# Patient Record
Sex: Female | Born: 1935 | Race: White | Hispanic: No | Marital: Married
Health system: Southern US, Community
[De-identification: ages and names within clinical notes are randomized; demographics above are authoritative.]

---

## 2004-03-14 ENCOUNTER — Ambulatory Visit: Payer: Self-pay | Admitting: General Surgery

## 2004-07-24 ENCOUNTER — Ambulatory Visit: Payer: Self-pay

## 2004-09-10 ENCOUNTER — Ambulatory Visit: Payer: Self-pay | Admitting: General Surgery

## 2004-12-24 ENCOUNTER — Ambulatory Visit: Payer: Self-pay | Admitting: Cardiology

## 2005-02-06 ENCOUNTER — Ambulatory Visit: Payer: Self-pay | Admitting: Internal Medicine

## 2005-03-09 ENCOUNTER — Ambulatory Visit: Payer: Self-pay | Admitting: General Surgery

## 2005-09-07 ENCOUNTER — Ambulatory Visit: Payer: Self-pay | Admitting: General Surgery

## 2006-03-08 ENCOUNTER — Ambulatory Visit: Payer: Self-pay | Admitting: General Surgery

## 2006-06-23 ENCOUNTER — Ambulatory Visit: Payer: Self-pay | Admitting: Cardiology

## 2006-08-30 ENCOUNTER — Ambulatory Visit: Payer: Self-pay | Admitting: Psychiatry

## 2006-09-13 ENCOUNTER — Ambulatory Visit: Payer: Self-pay | Admitting: General Surgery

## 2006-11-04 ENCOUNTER — Emergency Department: Payer: Self-pay | Admitting: Emergency Medicine

## 2006-11-04 ENCOUNTER — Other Ambulatory Visit: Payer: Self-pay

## 2006-11-09 ENCOUNTER — Inpatient Hospital Stay: Payer: Self-pay | Admitting: Unknown Physician Specialty

## 2006-11-24 ENCOUNTER — Other Ambulatory Visit: Payer: Self-pay

## 2008-01-27 ENCOUNTER — Other Ambulatory Visit: Payer: Self-pay

## 2008-01-27 ENCOUNTER — Emergency Department: Payer: Self-pay | Admitting: Emergency Medicine

## 2008-03-29 ENCOUNTER — Inpatient Hospital Stay: Payer: Self-pay | Admitting: Internal Medicine

## 2008-07-09 ENCOUNTER — Ambulatory Visit: Payer: Self-pay | Admitting: Oncology

## 2008-07-26 ENCOUNTER — Ambulatory Visit: Payer: Self-pay | Admitting: Oncology

## 2008-08-06 ENCOUNTER — Ambulatory Visit: Payer: Self-pay | Admitting: Oncology

## 2008-08-10 ENCOUNTER — Emergency Department: Payer: Self-pay | Admitting: Emergency Medicine

## 2008-08-23 ENCOUNTER — Ambulatory Visit: Payer: Self-pay | Admitting: Oncology

## 2008-09-06 ENCOUNTER — Ambulatory Visit: Payer: Self-pay | Admitting: Oncology

## 2008-10-06 ENCOUNTER — Ambulatory Visit: Payer: Self-pay | Admitting: Oncology

## 2008-11-06 ENCOUNTER — Ambulatory Visit: Payer: Self-pay | Admitting: Oncology

## 2008-12-06 ENCOUNTER — Ambulatory Visit: Payer: Self-pay | Admitting: Oncology

## 2009-02-06 ENCOUNTER — Ambulatory Visit: Payer: Self-pay | Admitting: Oncology

## 2009-02-14 ENCOUNTER — Ambulatory Visit: Payer: Self-pay | Admitting: Oncology

## 2009-03-08 ENCOUNTER — Ambulatory Visit: Payer: Self-pay | Admitting: Oncology

## 2009-05-16 ENCOUNTER — Ambulatory Visit: Payer: Self-pay | Admitting: Oncology

## 2009-05-27 ENCOUNTER — Ambulatory Visit: Payer: Self-pay | Admitting: Oncology

## 2009-06-08 ENCOUNTER — Ambulatory Visit: Payer: Self-pay | Admitting: Oncology

## 2009-07-09 ENCOUNTER — Ambulatory Visit: Payer: Self-pay | Admitting: Oncology

## 2011-08-28 ENCOUNTER — Ambulatory Visit: Payer: Self-pay | Admitting: Oncology

## 2011-08-28 LAB — CBC CANCER CENTER
Basophil #: 0 x10 3/mm (ref 0.0–0.1)
Basophil %: 0.3 %
Eosinophil #: 0.1 x10 3/mm (ref 0.0–0.7)
HCT: 28.8 % — ABNORMAL LOW (ref 35.0–47.0)
Lymphocyte #: 1.1 x10 3/mm (ref 1.0–3.6)
Lymphocyte %: 18 %
MCHC: 34.1 g/dL (ref 32.0–36.0)
MCV: 101 fL — ABNORMAL HIGH (ref 80–100)
Neutrophil #: 4.5 x10 3/mm (ref 1.4–6.5)
Platelet: 228 x10 3/mm (ref 150–440)
RBC: 2.87 10*6/uL — ABNORMAL LOW (ref 3.80–5.20)
RDW: 14 % (ref 11.5–14.5)

## 2011-09-07 ENCOUNTER — Ambulatory Visit: Payer: Self-pay | Admitting: Oncology

## 2013-04-20 ENCOUNTER — Emergency Department: Payer: Self-pay | Admitting: Internal Medicine

## 2013-04-20 LAB — URINALYSIS, COMPLETE
Blood: NEGATIVE
Glucose,UR: NEGATIVE mg/dL (ref 0–75)
Hyaline Cast: 3
Nitrite: POSITIVE
Protein: NEGATIVE
Squamous Epithelial: <1

## 2013-04-20 LAB — TROPONIN I: Troponin-I: <0.02 ng/mL

## 2013-04-20 LAB — COMPREHENSIVE METABOLIC PANEL
Anion Gap: 3 — ABNORMAL LOW (ref 7–16)
BUN: 17 mg/dL (ref 7–18)
Calcium, Total: 8.3 mg/dL — ABNORMAL LOW (ref 8.5–10.1)
Chloride: 107 mmol/L (ref 98–107)
Co2: 29 mmol/L (ref 21–32)
EGFR (African American): >60
EGFR (Non-African Amer.): 54 — ABNORMAL LOW
Glucose: 109 mg/dL — ABNORMAL HIGH (ref 65–99)
Osmolality: 280 (ref 275–301)
Potassium: 3.9 mmol/L (ref 3.5–5.1)
Sodium: 139 mmol/L (ref 136–145)
Total Protein: 8.6 g/dL — ABNORMAL HIGH (ref 6.4–8.2)

## 2013-04-20 LAB — CK TOTAL AND CKMB (NOT AT ARMC)
CK, Total: 48 U/L (ref 21–215)
CK-MB: 0.7 ng/mL (ref 0.5–3.6)

## 2013-04-20 LAB — CBC
MCH: 35.3 pg — ABNORMAL HIGH (ref 26.0–34.0)
MCHC: 34.2 g/dL (ref 32.0–36.0)
MCV: 103 fL — ABNORMAL HIGH (ref 80–100)
RDW: 13.6 % (ref 11.5–14.5)
WBC: 5.6 10*3/uL (ref 3.6–11.0)

## 2013-04-20 LAB — APTT: Activated PTT: 27.5 secs (ref 23.6–35.9)

## 2013-04-20 LAB — PRO B NATRIURETIC PEPTIDE: B-Type Natriuretic Peptide: 1084 pg/mL — ABNORMAL HIGH (ref 0–450)

## 2013-04-20 LAB — PROTIME-INR: Prothrombin Time: 13 secs (ref 11.5–14.7)

## 2013-05-30 ENCOUNTER — Emergency Department: Payer: Self-pay | Admitting: Emergency Medicine

## 2013-05-30 LAB — APTT: Activated PTT: 25 secs (ref 23.6–35.9)

## 2013-05-30 LAB — CBC
HCT: 26.8 % — ABNORMAL LOW (ref 35.0–47.0)
MCH: 33.6 pg (ref 26.0–34.0)
MCHC: 32.7 g/dL (ref 32.0–36.0)
MCV: 103 fL — ABNORMAL HIGH (ref 80–100)
Platelet: 189 10*3/uL (ref 150–440)
RDW: 13.8 % (ref 11.5–14.5)
WBC: 6.2 10*3/uL (ref 3.6–11.0)

## 2013-05-30 LAB — COMPREHENSIVE METABOLIC PANEL
Anion Gap: 3 — ABNORMAL LOW (ref 7–16)
BUN: 21 mg/dL — ABNORMAL HIGH (ref 7–18)
Calcium, Total: 8.7 mg/dL (ref 8.5–10.1)
Co2: 29 mmol/L (ref 21–32)
Creatinine: 1 mg/dL (ref 0.60–1.30)
EGFR (African American): 57 — ABNORMAL LOW
EGFR (Non-African Amer.): 50 — ABNORMAL LOW
Osmolality: 273 (ref 275–301)
Potassium: 3.7 mmol/L (ref 3.5–5.1)
SGOT(AST): 21 U/L (ref 15–37)
Sodium: 135 mmol/L — ABNORMAL LOW (ref 136–145)

## 2013-05-30 LAB — CK TOTAL AND CKMB (NOT AT ARMC)
CK, Total: 76 U/L (ref 21–215)
CK-MB: 1.1 ng/mL (ref 0.5–3.6)

## 2013-05-30 LAB — PROTIME-INR: INR: 1

## 2013-05-30 LAB — TROPONIN I: Troponin-I: <0.02 ng/mL

## 2013-05-31 LAB — URINALYSIS, COMPLETE
Bilirubin,UR: NEGATIVE
Ketone: NEGATIVE
Ph: 6 (ref 4.5–8.0)
Protein: NEGATIVE
RBC,UR: 1 /HPF (ref 0–5)
Specific Gravity: 1.018 (ref 1.003–1.030)

## 2013-07-16 ENCOUNTER — Emergency Department: Payer: Self-pay | Admitting: Emergency Medicine

## 2013-07-16 LAB — COMPREHENSIVE METABOLIC PANEL
ALK PHOS: 145 U/L — AB
Albumin: 2.9 g/dL — ABNORMAL LOW (ref 3.4–5.0)
Anion Gap: 7 (ref 7–16)
BUN: 13 mg/dL (ref 7–18)
Bilirubin,Total: 0.2 mg/dL (ref 0.2–1.0)
CHLORIDE: 102 mmol/L (ref 98–107)
CREATININE: 1.11 mg/dL (ref 0.60–1.30)
Calcium, Total: 8.6 mg/dL (ref 8.5–10.1)
Co2: 25 mmol/L (ref 21–32)
EGFR (African American): 51 — ABNORMAL LOW
EGFR (Non-African Amer.): 44 — ABNORMAL LOW
GLUCOSE: 116 mg/dL — AB (ref 65–99)
Osmolality: 269 (ref 275–301)
Potassium: 3.4 mmol/L — ABNORMAL LOW (ref 3.5–5.1)
SGOT(AST): 15 U/L (ref 15–37)
SGPT (ALT): 16 U/L (ref 12–78)
Sodium: 134 mmol/L — ABNORMAL LOW (ref 136–145)
Total Protein: 9.6 g/dL — ABNORMAL HIGH (ref 6.4–8.2)

## 2013-07-16 LAB — CBC WITH DIFFERENTIAL/PLATELET
Basophil #: 0 10*3/uL (ref 0.0–0.1)
Basophil %: 0.4 %
EOS ABS: 0 10*3/uL (ref 0.0–0.7)
Eosinophil %: 0.4 %
HCT: 26.5 % — ABNORMAL LOW (ref 35.0–47.0)
HGB: 8.7 g/dL — AB (ref 12.0–16.0)
Lymphocyte #: 1.9 10*3/uL (ref 1.0–3.6)
Lymphocyte %: 30 %
MCH: 33.9 pg (ref 26.0–34.0)
MCHC: 32.8 g/dL (ref 32.0–36.0)
MCV: 103 fL — ABNORMAL HIGH (ref 80–100)
Monocyte #: 0.5 x10 3/mm (ref 0.2–0.9)
Monocyte %: 7.8 %
NEUTROS ABS: 4 10*3/uL (ref 1.4–6.5)
NEUTROS PCT: 61.4 %
Platelet: 219 10*3/uL (ref 150–440)
RBC: 2.56 10*6/uL — AB (ref 3.80–5.20)
RDW: 14.9 % — ABNORMAL HIGH (ref 11.5–14.5)
WBC: 6.4 10*3/uL (ref 3.6–11.0)

## 2013-07-16 LAB — URINALYSIS, COMPLETE
BACTERIA: NEGATIVE
BILIRUBIN, UR: NEGATIVE
Blood: NEGATIVE
Glucose,UR: NEGATIVE mg/dL (ref 0–75)
Ketone: NEGATIVE
Leukocyte Esterase: NEGATIVE
Nitrite: NEGATIVE
PH: 5 (ref 4.5–8.0)
RBC, UR: NONE SEEN /HPF (ref 0–5)
Specific Gravity: 1.018 (ref 1.003–1.030)

## 2013-07-16 LAB — TROPONIN I

## 2013-07-17 ENCOUNTER — Emergency Department: Payer: Self-pay | Admitting: Emergency Medicine

## 2013-07-17 LAB — CBC
HCT: 24 % — AB (ref 35.0–47.0)
HGB: 8.1 g/dL — ABNORMAL LOW (ref 12.0–16.0)
MCH: 34.6 pg — ABNORMAL HIGH (ref 26.0–34.0)
MCHC: 33.7 g/dL (ref 32.0–36.0)
MCV: 102 fL — ABNORMAL HIGH (ref 80–100)
PLATELETS: 196 10*3/uL (ref 150–440)
RBC: 2.34 10*6/uL — ABNORMAL LOW (ref 3.80–5.20)
RDW: 15.1 % — ABNORMAL HIGH (ref 11.5–14.5)
WBC: 8.8 10*3/uL (ref 3.6–11.0)

## 2013-07-18 LAB — COMPREHENSIVE METABOLIC PANEL
AST: 24 U/L (ref 15–37)
Albumin: 3.1 g/dL — ABNORMAL LOW (ref 3.4–5.0)
Alkaline Phosphatase: 134 U/L — ABNORMAL HIGH
Anion Gap: 7 (ref 7–16)
BILIRUBIN TOTAL: 0.3 mg/dL (ref 0.2–1.0)
BUN: 18 mg/dL (ref 7–18)
CALCIUM: 8.9 mg/dL (ref 8.5–10.1)
CHLORIDE: 101 mmol/L (ref 98–107)
Co2: 27 mmol/L (ref 21–32)
Creatinine: 0.95 mg/dL (ref 0.60–1.30)
EGFR (Non-African Amer.): 53 — ABNORMAL LOW
Glucose: 122 mg/dL — ABNORMAL HIGH (ref 65–99)
Osmolality: 273 (ref 275–301)
POTASSIUM: 3.1 mmol/L — AB (ref 3.5–5.1)
SGPT (ALT): 22 U/L (ref 12–78)
SODIUM: 135 mmol/L — AB (ref 136–145)
Total Protein: 9.9 g/dL — ABNORMAL HIGH (ref 6.4–8.2)

## 2013-07-18 LAB — URINALYSIS, COMPLETE
BLOOD: NEGATIVE
Bilirubin,UR: NEGATIVE
GLUCOSE, UR: NEGATIVE mg/dL (ref 0–75)
KETONE: NEGATIVE
NITRITE: NEGATIVE
PH: 6 (ref 4.5–8.0)
Specific Gravity: 1.018 (ref 1.003–1.030)
Squamous Epithelial: <1

## 2013-07-18 LAB — TROPONIN I

## 2013-07-20 LAB — URINE CULTURE

## 2013-08-06 ENCOUNTER — Ambulatory Visit: Payer: Self-pay | Admitting: Internal Medicine

## 2013-08-08 ENCOUNTER — Inpatient Hospital Stay: Payer: Self-pay | Admitting: Specialist

## 2013-08-08 LAB — COMPREHENSIVE METABOLIC PANEL
ALBUMIN: 2.6 g/dL — AB (ref 3.4–5.0)
ANION GAP: 5 — AB (ref 7–16)
AST: 19 U/L (ref 15–37)
Alkaline Phosphatase: 123 U/L — ABNORMAL HIGH
BILIRUBIN TOTAL: 0.1 mg/dL — AB (ref 0.2–1.0)
BUN: 23 mg/dL — AB (ref 7–18)
CHLORIDE: 108 mmol/L — AB (ref 98–107)
CO2: 25 mmol/L (ref 21–32)
Calcium, Total: 7.9 mg/dL — ABNORMAL LOW (ref 8.5–10.1)
Creatinine: 1.5 mg/dL — ABNORMAL HIGH (ref 0.60–1.30)
EGFR (African American): 35 — ABNORMAL LOW
EGFR (Non-African Amer.): 30 — ABNORMAL LOW
Glucose: 91 mg/dL (ref 65–99)
OSMOLALITY: 279 (ref 275–301)
POTASSIUM: 3.9 mmol/L (ref 3.5–5.1)
SGPT (ALT): 14 U/L (ref 12–78)
Sodium: 138 mmol/L (ref 136–145)
TOTAL PROTEIN: 8.7 g/dL — AB (ref 6.4–8.2)

## 2013-08-08 LAB — FERRITIN: Ferritin (ARMC): 173 ng/mL (ref 8–388)

## 2013-08-08 LAB — CBC
HCT: 18.7 % — ABNORMAL LOW (ref 35.0–47.0)
HGB: 6.2 g/dL — ABNORMAL LOW (ref 12.0–16.0)
MCH: 34.1 pg — AB (ref 26.0–34.0)
MCHC: 33.1 g/dL (ref 32.0–36.0)
MCV: 103 fL — ABNORMAL HIGH (ref 80–100)
Platelet: 190 10*3/uL (ref 150–440)
RBC: 1.82 10*6/uL — ABNORMAL LOW (ref 3.80–5.20)
RDW: 16.4 % — ABNORMAL HIGH (ref 11.5–14.5)
WBC: 5.7 10*3/uL (ref 3.6–11.0)

## 2013-08-08 LAB — CBC WITH DIFFERENTIAL/PLATELET
Basophil #: 0 10*3/uL (ref 0.0–0.1)
Basophil %: 0.8 %
EOS ABS: 0 10*3/uL (ref 0.0–0.7)
EOS PCT: 0.6 %
HCT: 23.3 % — ABNORMAL LOW (ref 35.0–47.0)
HGB: 7.8 g/dL — ABNORMAL LOW (ref 12.0–16.0)
Lymphocyte #: 1.4 10*3/uL (ref 1.0–3.6)
Lymphocyte %: 26.3 %
MCH: 32.7 pg (ref 26.0–34.0)
MCHC: 33.4 g/dL (ref 32.0–36.0)
MCV: 98 fL (ref 80–100)
MONO ABS: 0.4 x10 3/mm (ref 0.2–0.9)
Monocyte %: 7.1 %
NEUTROS ABS: 3.6 10*3/uL (ref 1.4–6.5)
Neutrophil %: 65.2 %
Platelet: 200 10*3/uL (ref 150–440)
RBC: 2.38 10*6/uL — ABNORMAL LOW (ref 3.80–5.20)
RDW: 18.8 % — AB (ref 11.5–14.5)
WBC: 5.5 10*3/uL (ref 3.6–11.0)

## 2013-08-08 LAB — RETICULOCYTES
Absolute Retic Count: 0.0298 10*6/uL (ref 0.019–0.186)
Reticulocyte: 1.65 % (ref 0.4–3.1)

## 2013-08-08 LAB — IRON: Iron: 216 ug/dL — ABNORMAL HIGH (ref 50–170)

## 2013-09-06 ENCOUNTER — Ambulatory Visit: Payer: Self-pay | Admitting: Internal Medicine

## 2013-09-06 DEATH — deceased

## 2014-09-29 NOTE — H&P (Signed)
PATIENT NAME:  Veronica Lopez, DECARLO MR#:  [id number] DATE OF BIRTH:  04/30/1921  DATE OF ADMISSION:  08/08/2013  REFERRING PHYSICIAN:   Dr. Charlesetta Ivory.   PRIMARY CARE PHYSICIAN:  Dr. Hoy Morn from [facility].  CHIEF COMPLAINT: Anemia.   HISTORY OF PRESENT ILLNESS: This is a 81 year old female with history of advanced dementia, sent from nursing home for anemia. The patient had routine blood work done, which the result showing low hemoglobin. Nursing home was ordered by the physician to sent the patient to the ED for transfusion. Nursing staff does not recall what is the value and they did not have a documentation then.  Upon presentation, the patient had basic workup done, which did show her hemoglobin to be of 6.2. The patient had hemoglobin done previously before 3 weeks, on February 9th, where it was 8.1 then. The patient was Hemoccult negative in the ED. The patient, as well, has no history of any melena, any bright red blood per rectum or any coffee-ground emesis The patient has advanced dementia. Cannot give any reliable history. History was mainly obtained from ED staff and previous documentation. It seems the patient had anemia and has been seen in hematology in the past; last time in 2013 for workup for anemia, where she was on Procrit then for anemia of chronic disease and possible multiple myeloma.   PAST MEDICAL HISTORY: 1.  Hypertension.  2.  Depression.  3.  History of aortic aneurysm status post repair.  4.  History of mitral valve stenosis, status post replacement with porcine valve.  5.  History of breast cancer in the past.  6.  History of gastroesophageal reflux disease.  7.  Anxiety.  8.  Multiple myeloma.  9.  Congestive heart failure.  10.  Dementia. 11.  Renal insufficiency.  12.  Anemia.   PAST SURGICAL HISTORY:  1.  Mastectomy.  2.  Mitral valve replacement.  3.  Right lobectomy.   SOCIAL HISTORY: She does not smoke, does not drink alcohol. Lives in [facility], which is an assisted living facility.   FAMILY HISTORY: No history of coronary artery disease, hypertension or diabetes in the family.   ALLERGIES: No known drug allergies.   HOME MEDICATIONS: 1.  Cyanocobalamin 1000 mg injection every month.  2.  Alprazolam 0.5 mg oral 1/2 tablet by mouth daily.  3.  Amlodipine 10 mg oral daily.  4.  Sertraline 150 mg oral daily.  5.  Losartan 50 mg oral daily.  6.  Senna Plus 1 tablet twice a day.  7.  Metoclopramide 1 tablet by mouth 4 times a day.  8.  Trazodone 50 mg oral at bedtime.  9.  Loperamide as needed.  10.  Albuterol as needed.  11.  Tylenol as needed.  12.  Milk of magnesia as needed.  13.  Zofran as needed.  14.  MiraLAX as needed.   REVIEW OF SYSTEMS: The patient has advanced dementia, very poor historian; cannot give reliable review of systems.   PHYSICAL EXAMINATION: VITAL SIGNS: Temperature 98.9, pulse 72, respiratory rate 18, blood pressure 145/73, saturating 98% on room air.  GENERAL: Frail, elderly female, looks comfortable in bed, in no apparent distress.  HEENT: Head atraumatic, normocephalic. Pupils equal and reactive to light. Pale conjunctivae. Anicteric sclerae. Dry oral mucosa.  NECK: Supple. No thyromegaly. No JVD.  CHEST: Good air entry bilaterally. No wheezing, rales or rhonchi.  CARDIOVASCULAR: S1, S2 heard. No rubs or gallops. Has systolic ejection murmur.  ABDOMEN: Soft, nontender, nondistended. Bowel sounds present.  EXTREMITIES: No edema. No clubbing. No cyanosis.  PSYCHIATRIC: The patient is awake. Flat affect. Only oriented to name.  NEUROLOGIC:  Cranial nerves grossly intact. Motor: Appears to be moving all extremities without gross deficits.  SKIN: Pale, warm and dry.  MUSCULOSKELETAL: No joint effusion or erythema. RECTAL:  Exam was done by me. The patient is Hemoccult negative. Does have mild external hemorrhoid, but there appears to be no source of bleed; as well, her stool is  normal in  color.    PERTINENT LABORATORY DATA:  Glucose 91, BUN 23, creatinine 1.50, sodium 138, potassium 3.9, chloride 108, CO2 of 25. Total protein 8.7, albumin 2.6, bili 0.1, alk phos  123. White blood cell 5.7, hemoglobin 6.2, platelets 190.   ASSESSMENT AND PLAN: 1.  Anemia. The patient presents with significant anemia. She has baseline anemia due to anemia of chronic disease and possible multiple myeloma, but she does have significant drop over the last 2 weeks of 2 grams, so I doubt any of these chronic causes would cause it to happen. We will send anemia workup for the time being. We will transfuse the patient 2 units of packed red blood cells. Currently, the patient is Hemoccult negative. We will obtain CT abdomen and pelvis to evaluate for any source of intra-abdominal bleed, possible retroperitoneal bleed, even though it is unlikely as she not on anticoagulation. Consent was obtained by the phone from the patient's husband. 2.  Acute renal failure. The patient had bump in her creatinine. This is most likely related to volume depletion from her anemia. We will hold the patient's losartan. We will continue with the fluids, and we will hydrate appropriately. We will repeat level in the morning.  3.  Hypertension. Blood pressure is acceptable, but we will hold all her meds until she is more stable.  4.  History of dementia. We will continue with supportive care.  5.  (Dictation Anomaly) malnutrition.  The patient appears malnourished and her albumin is low. We will start her on dietary nutritional supplements of Ensure when her n.p.o. is discontinued after she is stable, pending her imaging study.  6.  Deep vein thrombosis prophylaxis. Hold all forms of chemical anticoagulation due to her significant anemia requiring transfusion, pending the workup. We will continue with sequential compression devices.   CODE STATUS: This was discussed with the husband who confirms the patient is DO NOT RESUSCITATE; as  well, the patient has DNR form from the assisted living facility with her.  TOTAL TIME SPENT ON ADMISSION AND PATIENT CARE: 50 minutes.     ____________________________ Albertine Patricia, MD dse:dmm D: 08/08/2013 05:06:00 ET T: 08/08/2013 12:35:53 ET JOB#: [id number]  cc: Albertine Patricia, MD, <Dictator> DAWOOD Graciela Husbands MD ELECTRONICALLY SIGNED 08/10/2013 9:03

## 2014-09-29 NOTE — Discharge Summary (Signed)
PATIENT NAME:  Veronica Lopez, Veronica Lopez MR#:  [id number] DATE OF BIRTH:  1920/09/22  DATE OF ADMISSION:  08/08/2013 DATE OF DISCHARGE:  08/08/2013  For a detailed note, please see the history and physical done on admission by Dr. Waldron Labs.   DIAGNOSES AT DISCHARGE: 1.  Acute on chronic anemia, status post transfusion.  2.  History of breast cancer.  3.  History of multiple myeloma.  4.  Dementia.  5.  Gastroesophageal reflux disease.  6.  Hypertension.  7.  Anxiety/depression.    DISPOSITION: The patient is being discharged back to her assisted living facility with hospice services.   FOLLOWUP: With Dr. Hortencia Pilar in the next 1 to 2 weeks.   DIET: The patient is being discharged on a low-sodium mechanical soft diet.   DISCHARGE ACTIVITY: As tolerated.   DISCHARGE MEDICATIONS: Vitamin B12 1000 mcg intramuscular monthly, amlodipine 10 mg daily, Zoloft 100 mg 1-1/2 tabs daily, losartan 50 mg b.i.d., Reglan 10 mg q.i.d., trazodone 50 mg at bedtime, MiraLax daily as needed, Proctozone rectal cream to be applied t.i.d. as needed, Xanax 0.5 mg b.i.d., Senokot 50/8.6, 1 tab b.i.d., Xanax 0.25 mg daily at noon, albuterol nebulizer 4 times daily as needed, loperamide 2 mg one cap q. two hours as needed, Tylenol 650, 2 tabs q.4 hours as needed, milk of magnesia 30 mL b.i.d., Zofran 8 mg daily as needed.   CONSULTANTS DURING HOSPITAL COURSE: Dr. Izora Gala Phifer of palliative care.   PERTINENT STUDIES DONE THE HOSPITAL COURSE: Are as follows: A CT scan of the abdomen and pelvis done without contrast on admission showing no acute intra-abdominal hematoma, acute unhealed L4 compression fracture with mild retropulsion L1, L2, L3 compression fractures appear remote.   BRIEF HOSPITAL COURSE: This is a 81 year old female who resides at [facility], presented to the hospital, as she was notified that her lab values at her primary care physician's office were abnormal.   1.  Acute on chronic  anemia. The patient apparently has a baseline hemoglobin of around 8 to 9; hemoglobin was noted to be as low as 6.2 on blood work done in her primary care physician's office. Therefore, she was referred to the hospital. She underwent 1 unit packed red cells transfusion. Her hemoglobin has improved to over 7.8. The patient's family does not want to pursue any aggressive care and does not want any further aggressive intervention or work-up for the anemia. They have opted more for comfort care. A palliative care consult was obtained. The patient was seen by Dr. Ermalinda Memos. She discussed the case with the patient's family, and they have agreed to discharge her back to the [facility] with a new hospice service, and not to be rehospitalized again.  2.  Hypertension. The patient's hemodynamics remained stable in the hospital. She will resume her medications as stated.  3.  Anxiety/depression. The patient will resume her medications as stated above for anxiety, depression.   The patient's son, who is the power of attorney, and the husband do not want to pursue any aggressive care for this patient. They have switched hospice services. At this point, the patient is being discharged back to [facility], not to be rehospitalized again.   CODE STATUS: The patient is a do not intubate/do not resuscitate.   TIME SPENT: 45 minutes.   ____________________________ Belia Heman. Verdell Carmine, MD vjs:cg D: 08/08/2013 16:42:36 ET T: 08/09/2013 05:00:23 ET JOB#: [id number]  cc: Belia Heman. Verdell Carmine, MD, <Dictator> Kerin Perna, MD Harriette Ohara  Gaetano Net MD ELECTRONICALLY SIGNED 08/20/2013 22:33

## 2015-08-24 IMAGING — CR DG CHEST 1V PORT
1 series · 1 of 1 positions shown · non-contrast
Comparison: 03/30/2008

CLINICAL DATA: Chest pain

EXAM:
PORTABLE CHEST - 1 VIEW

[ap]
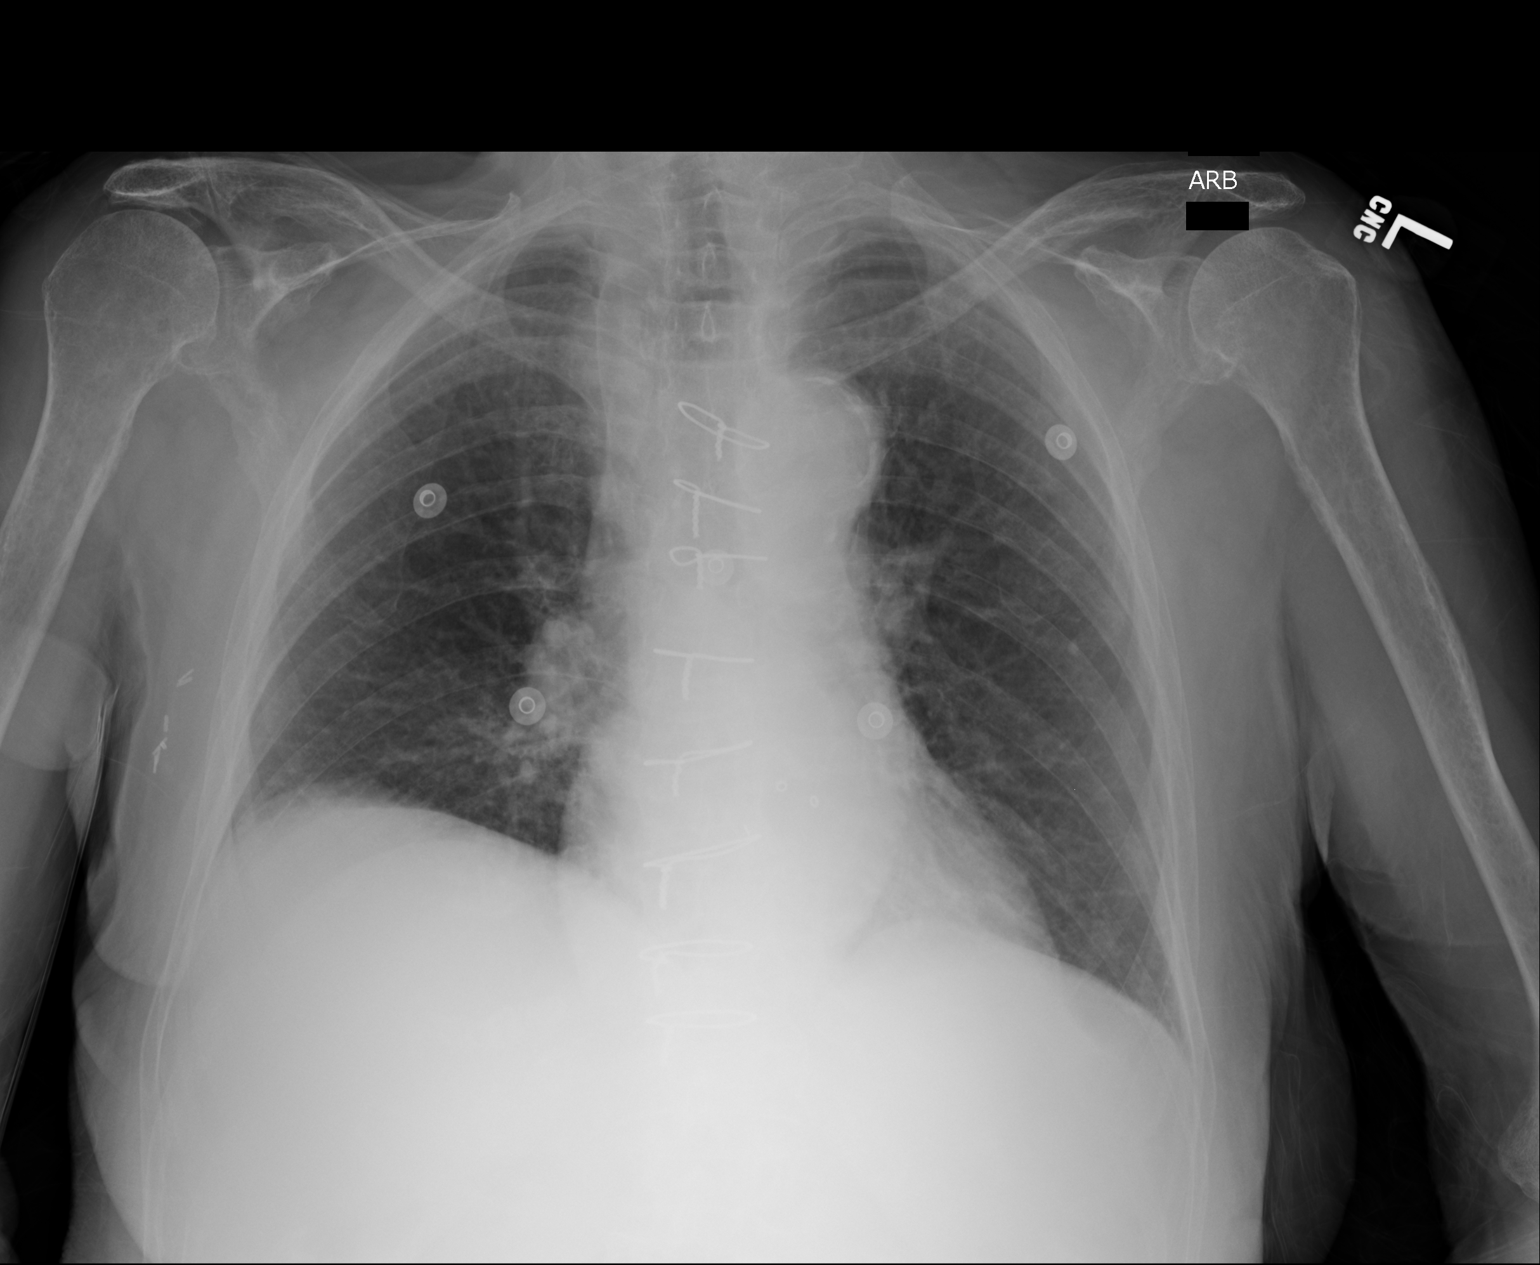

[1 of 1 positions shown; findings below may reference images not displayed]

FINDINGS: The cardiac shadow is stable. Postsurgical changes are again noted.
Previously seen infiltrate has resolved in the interval. Elevation
the right hemidiaphragm is noted. No focal infiltrate or sizable
effusion is seen. No bony abnormality is noted.
IMPRESSION: No acute abnormality noted.

## 2015-10-03 IMAGING — CT CT HEAD WITHOUT CONTRAST
3 of 5 series · 16 of 47 positions shown, 19 images · non-contrast
Comparison: CT head 08/10/2008.

CLINICAL DATA: Fall from toilet.  Abrasion to the left forehead.

EXAM:
CT HEAD WITHOUT CONTRAST
CT CERVICAL SPINE WITHOUT CONTRAST
TECHNIQUE: Multidetector CT imaging of the head and cervical spine was
performed following the standard protocol without intravenous
contrast. Multiplanar CT image reconstructions of the cervical spine
were also generated.

[Series 6: sag bone · sagittal · 0.34mm/px · 5 of 41 slices shown, 6 images]
[im 7/41  bone]
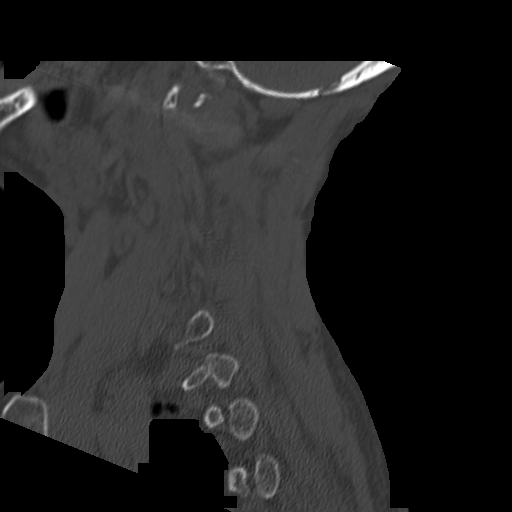
[im 14/41  bone]
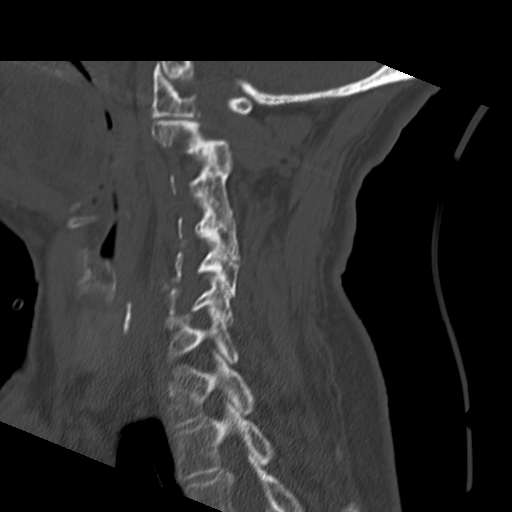
[im 21/41  brain]
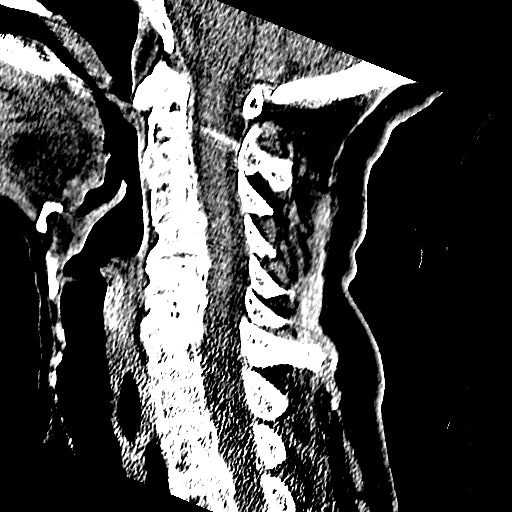
[im 21/41  bone]
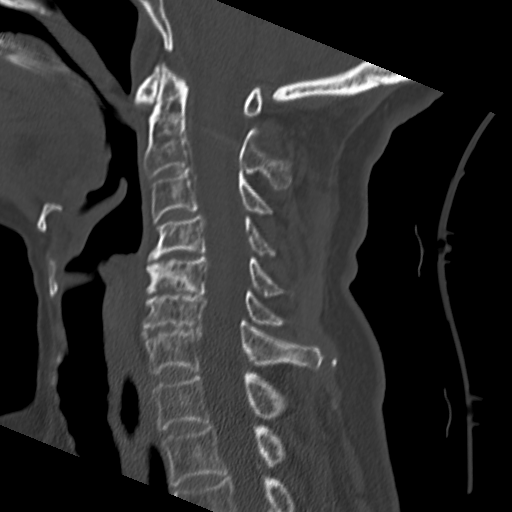
[im 27/41  bone]
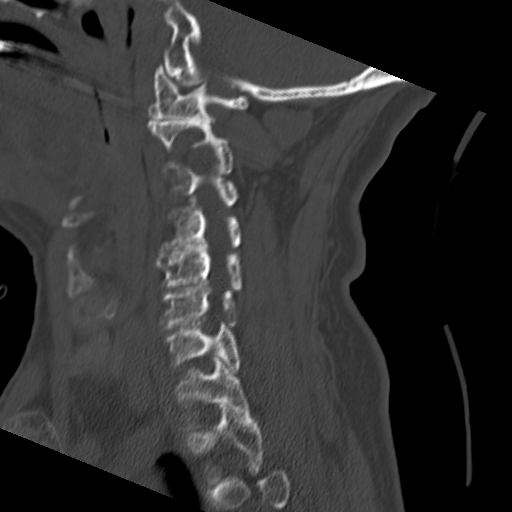
[im 34/41  bone]
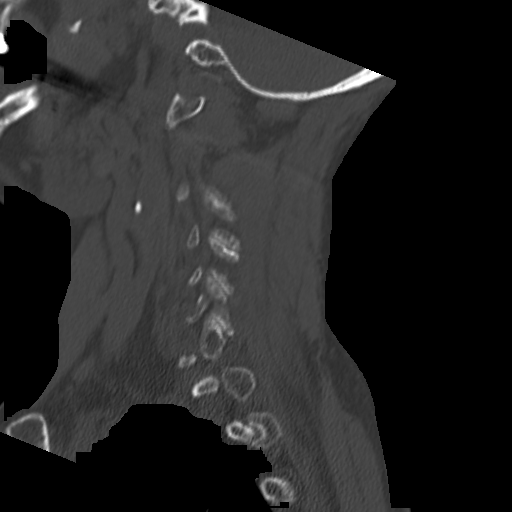

[Series 7: cor bone · coronal · 0.34mm/px · 3 of 34 slices shown]
[im 12/34  bone]
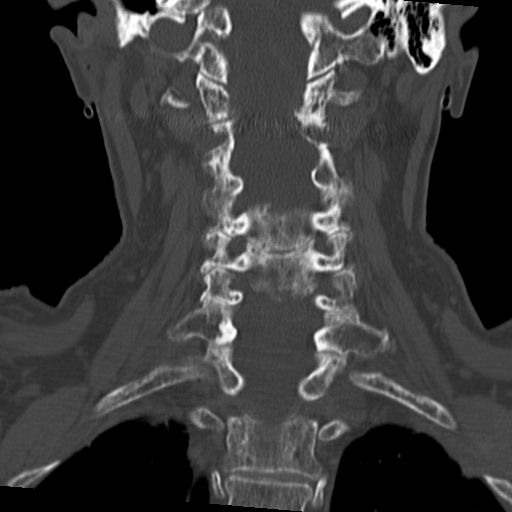
[im 15/34  bone]
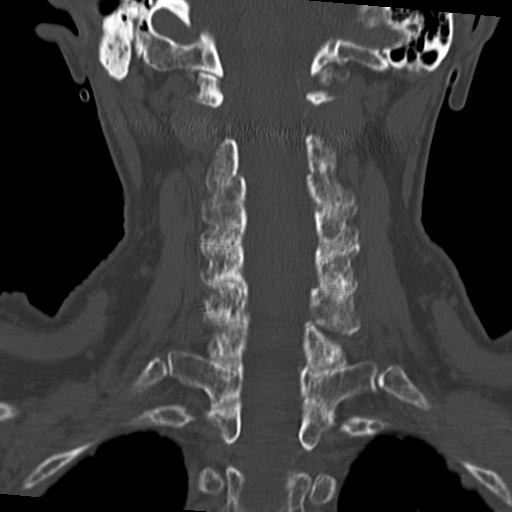
[im 19/34  bone]
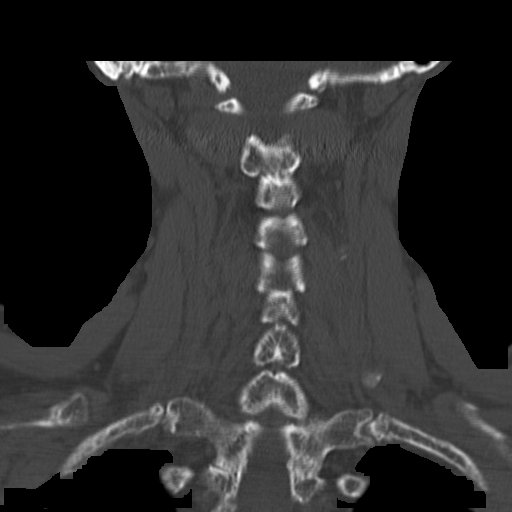

[Series 8: orthogonal axials · axial · 0.29mm/px · z∈[+552,+680]mm · 8 of 83 slices shown, 10 images]
[im 7/83  brain]
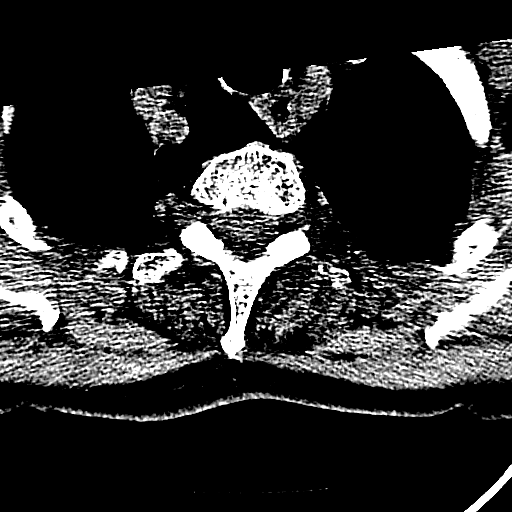
[im 7/83  bone]
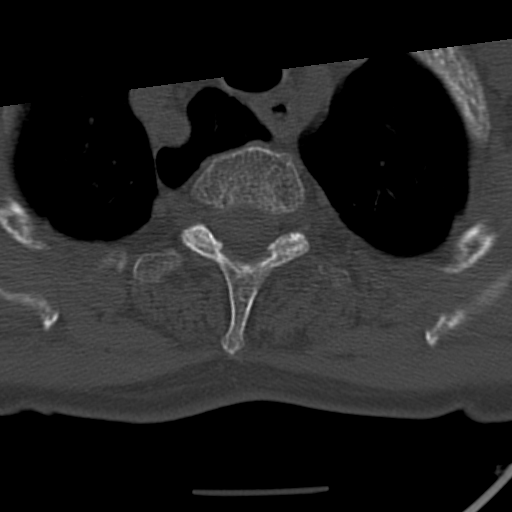
[im 21/83  bone]
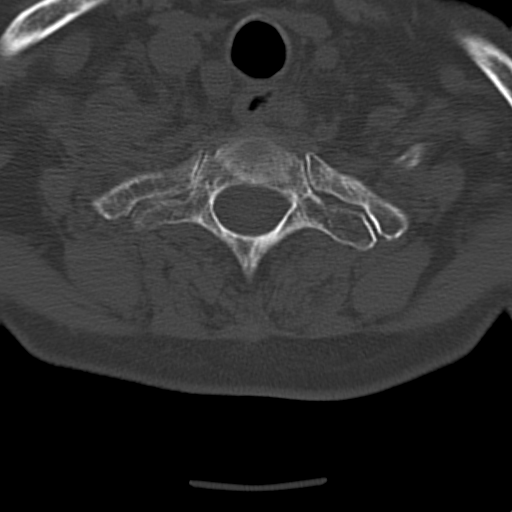
[im 28/83  bone]
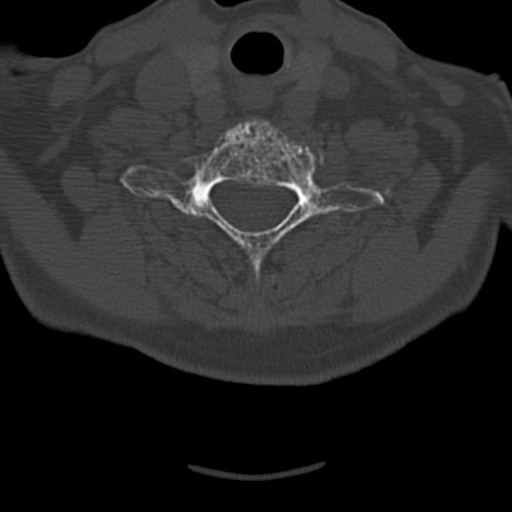
[im 35/83  bone]
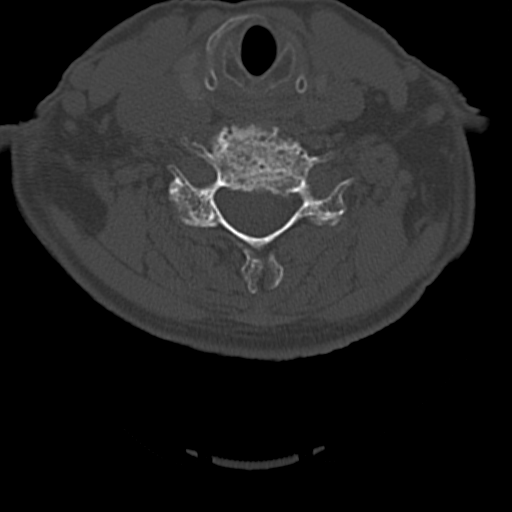
[im 48/83  brain]
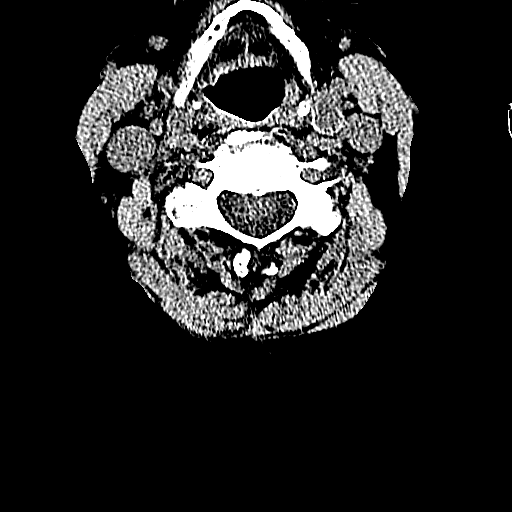
[im 48/83  bone]
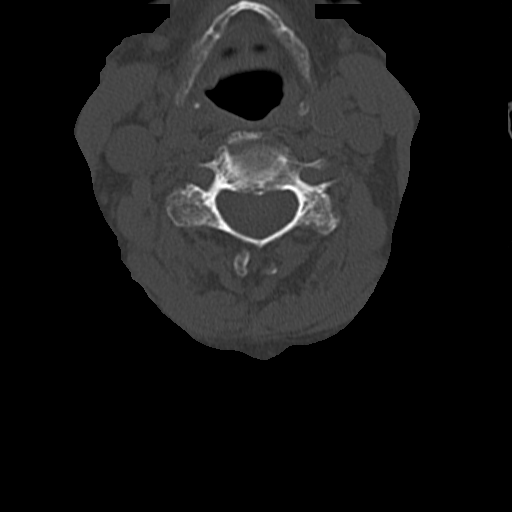
[im 55/83  bone]
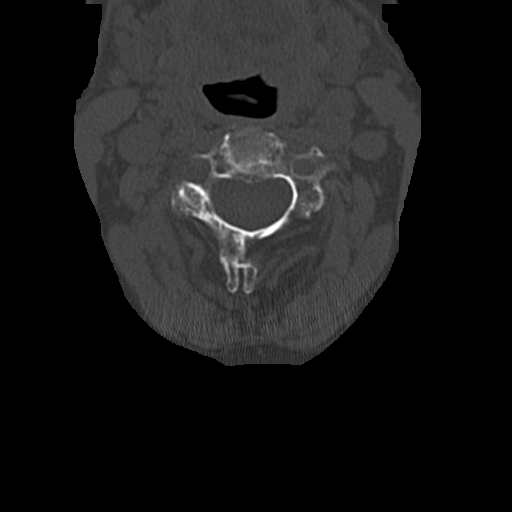
[im 62/83  bone]
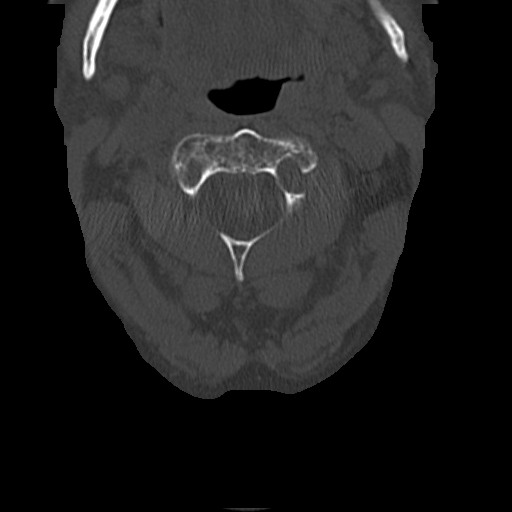
[im 76/83  bone]
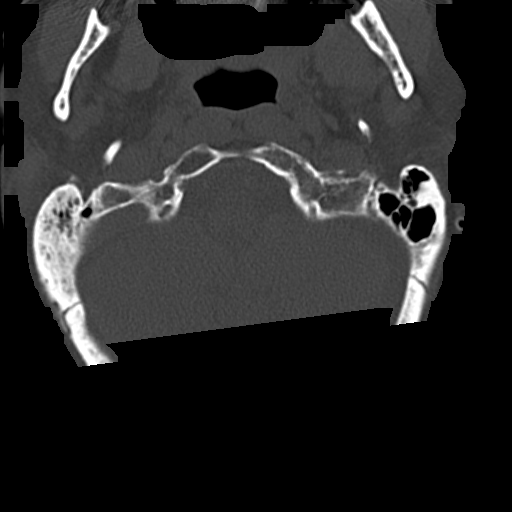

[16 of 47 positions shown; findings below may reference images not displayed]

FINDINGS: CT HEAD FINDINGS

No mass lesion, mass effect, midline shift, hydrocephalus,
hemorrhage. No acute territorial cortical ischemia/infarct. Atrophy
and chronic ischemic white matter disease is present. Calvarium
intact. Intracranial atherosclerosis is present. Small right basal
ganglia and left thalamic lacunar infarcts present, chronic.

CT CERVICAL SPINE FINDINGS

Reversal of the normal cervical lordosis is present around C4-C5. 3
mm anterolisthesis of C3 on C4 is probably degenerative. Ankylosis
of the C2 through C4 facet joints. Discogenic sclerosis of vertebral
bodies in the mid cervical spine. The odontoid is intact. Occipital
condyles appear intact. Cystic changes are present in the dens.
Carotid atherosclerosis is present. Osteopenia. Lung apices are
within normal limits. Azygos fissure incidentally noted. Mild
levoconvex torticollis is probably positional.
IMPRESSION: 1. No acute intracranial abnormality.
2. Atrophy and chronic ischemic white matter disease with scattered
lacunar infarcts.
3. Cervical spondylosis with ankylosis from C2 through C4. No
cervical spine fracture or dislocation.
